# Patient Record
Sex: Male | Born: 1995 | Race: White | Hispanic: No
Health system: Southern US, Community
[De-identification: ages and names within clinical notes are randomized; demographics above are authoritative.]

---
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
## 2013-10-18 ENCOUNTER — Emergency Department (INDEPENDENT_AMBULATORY_CARE_PROVIDER_SITE_OTHER)
Admission: EM | Admit: 2013-10-18 | Discharge: 2013-10-18 | Disposition: A | Discharge status: Home / Self Care | Payer: Self-pay | Source: Home / Self Care

## 2013-10-18 ENCOUNTER — Encounter (HOSPITAL_COMMUNITY): Payer: Self-pay | Admitting: Emergency Medicine

## 2013-10-18 DIAGNOSIS — J029 Acute pharyngitis, unspecified: Secondary | ICD-10-CM

## 2013-10-18 DIAGNOSIS — J390 Retropharyngeal and parapharyngeal abscess: Secondary | ICD-10-CM

## 2013-10-18 MED ORDER — CEFTRIAXONE SODIUM 1 G IJ SOLR
INTRAMUSCULAR | Status: AC
  Filled 2013-10-18: qty 10

## 2013-10-18 MED ORDER — CLINDAMYCIN HCL 300 MG PO CAPS: 300.0000 mg | ORAL_CAPSULE | Freq: Three times a day (TID) | ORAL | Status: DC

## 2013-10-18 MED ORDER — METHYLPREDNISOLONE 4 MG PO KIT: PACK | ORAL | Status: DC

## 2013-10-18 MED ORDER — CEFTRIAXONE SODIUM 1 G IJ SOLR
1.0000 g | Freq: Once | INTRAMUSCULAR | Status: AC
  Administered 2013-10-18: 1 g via INTRAMUSCULAR

## 2013-10-18 NOTE — ED Notes (Signed)
Pt  Reports      Symptoms  Of  sorethroat      And  Pain when  She  Swallows  -   He  Reports earache as  Well       -  Pt  States  He  Took  Some  amoxcicillin he  Had  On  Hand  Last  Week        But  Still  Has  Symptoms

## 2013-10-18 NOTE — ED Provider Notes (Signed)
CSN: [id number]     Arrival date & time 10/18/13  1128 History   First MD Initiated Contact with Patient 10/18/13 1304     Chief Complaint  Patient presents with  . Sore Throat   (Consider location/radiation/quality/duration/timing/severity/associated sxs/prior Treatment) HPI Comments: 17 year old male is accompanied by his mother with a complaint of sore throat developing approximately 3 weeks ago. It is associated with earaches fever and mild PND. His mother he been given him some amoxicillin that she had left over at home. He is not improved. States that the sore throat has not improved.   History reviewed. No pertinent past medical history. History reviewed. No pertinent past surgical history. History reviewed. No pertinent family history. History  Substance Use Topics  . Smoking status: Current Every Day Smoker  . Smokeless tobacco: Not on file  . Alcohol Use: Yes    Review of Systems  Constitutional: Positive for fever, activity change and appetite change.  HENT: Positive for postnasal drip, rhinorrhea and sore throat. Negative for congestion.   Respiratory: Negative for cough, choking, shortness of breath, wheezing and stridor.   Gastrointestinal: Negative.   Skin: Negative for rash.  Neurological: Negative.     Allergies  Review of patient's allergies indicates no known allergies.  Home Medications   Current Outpatient Rx  Name  Route  Sig  Dispense  Refill  . clindamycin (CLEOCIN) 300 MG capsule   Oral   Take 1 capsule (300 mg total) by mouth 3 (three) times daily. X 10 days   30 capsule   0   . methylPREDNISolone (MEDROL DOSEPAK) 4 MG tablet      follow package directions   21 tablet   0    BP 105/76  Pulse 64  Temp(Src) 98.7 F (37.1 C) (Oral)  Resp 14  SpO2 98% Physical Exam  Nursing note and vitals reviewed. Constitutional: He is oriented to person, place, and time. He appears well-developed and well-nourished. No distress.  HENT:   Mouth/Throat: Oropharyngeal exudate present.  Bilateral TMs are translucent. Both are retracted. Oral pharynx is deeply erythematous. About time March is normal on the right however there is a small spherical area of swollen tissue that is protruding anteriorly against the left palatine arch. It is not protrude toward the midline and the uvula is unaffected. The uvula is at the midline. The airway is widely patent. There is exudate on the small area of the "abscess" in the left retropharyngeal space.  Neck: Normal range of motion. Neck supple.  Cardiovascular: Normal rate, regular rhythm and normal heart sounds.   Pulmonary/Chest: Effort normal and breath sounds normal. No respiratory distress.  Musculoskeletal: He exhibits no edema.  Lymphadenopathy:    He has cervical adenopathy.  Neurological: He is alert and oriented to person, place, and time. He exhibits normal muscle tone.  Skin: Skin is warm and dry.  Psychiatric: He has a normal mood and affect.    ED Course  Procedures (including critical care time) Labs Review Labs Reviewed  POCT RAPID STREP A ([facility] ONLY)   Imaging Review No results found. Results for orders placed during the hospital encounter of 10/18/13  POCT RAPID STREP A ([facility] ONLY)      Result Value Range   Streptococcus, Group A Screen (Direct) NEGATIVE  NEGATIVE        MDM   1. Exudative pharyngitis   2. Retropharyngeal and parapharyngeal abscess    Rocephin 1gm IM Medrol Dosepack Clindamycin 300 tid  x 10 d. For worsening go to the ED. Otherwise f/u with your ENT for which you are established, per pt request. Spoke with the on Call ENT nurse    Hayden Rasmussen, NP 10/18/13 1345

## 2013-10-21 LAB — CULTURE, GROUP A STREP

## 2013-10-21 NOTE — ED Provider Notes (Signed)
Medical screening examination/treatment/procedure(s) were performed by a resident physician or non-physician practitioner and as the supervising physician I was immediately available for consultation/collaboration.  Lillyann Ahart, MD    Brittania Sudbeck S Jame Morrell, MD 10/21/13 0900 

## 2015-04-25 ENCOUNTER — Encounter (HOSPITAL_COMMUNITY): Payer: Self-pay | Admitting: *Deleted

## 2015-04-25 ENCOUNTER — Emergency Department (INDEPENDENT_AMBULATORY_CARE_PROVIDER_SITE_OTHER)
Admission: EM | Admit: 2015-04-25 | Discharge: 2015-04-25 | Disposition: A | Discharge status: Home / Self Care | Payer: Self-pay | Source: Home / Self Care | Attending: Family Medicine | Admitting: Family Medicine

## 2015-04-25 DIAGNOSIS — L237 Allergic contact dermatitis due to plants, except food: Secondary | ICD-10-CM

## 2015-04-25 MED ORDER — TOBRAMYCIN-DEXAMETHASONE 0.3-0.1 % OP OINT: 1.0000 "application " | TOPICAL_OINTMENT | Freq: Three times a day (TID) | OPHTHALMIC | Status: DC

## 2015-04-25 NOTE — ED Notes (Addendum)
Pt  States  yest  He  Was  weedeating    Without  Glasses   And debris  Flew  Into  l  Eye   Pt  States  He  Rubbed  His  Eye        And  Now  He  Has  Some  Redness  And  puffyness    Present

## 2015-04-25 NOTE — ED Provider Notes (Addendum)
CSN: [id number]     Arrival date & time 04/25/15  1358 History   None    Chief Complaint  Patient presents with  . Eye Problem   (Consider location/radiation/quality/duration/timing/severity/associated sxs/prior Treatment) Patient is a 19 y.o. male presenting with eye problem. The history is provided by the patient.  Eye Problem Location:  L eye Quality: itching sx of lid. Onset quality:  Sudden Progression:  Unchanged Chronicity:  New Context: direct trauma   Context comment:  Weedeater use yest, without eye protection, something got on eye.-left Relieved by:  None tried Worsened by:  Nothing tried Associated symptoms: facial rash, itching and redness   Associated symptoms: no blurred vision, no decreased vision, no discharge, no double vision, no photophobia and no tearing   Associated symptoms comment:  Lower lid erythema and pruritis.   History reviewed. No pertinent past medical history. History reviewed. No pertinent past surgical history. History reviewed. No pertinent family history. History  Substance Use Topics  . Smoking status: Current Every Day Smoker  . Smokeless tobacco: Not on file  . Alcohol Use: Yes    Review of Systems  Constitutional: Negative.   HENT: Negative.   Eyes: Positive for redness and itching. Negative for blurred vision, double vision, photophobia, pain, discharge and visual disturbance.    Allergies  Review of patient's allergies indicates no known allergies.  Home Medications   Prior to Admission medications   Medication Sig Start Date End Date Taking? Authorizing Provider  clindamycin (CLEOCIN) 300 MG capsule Take 1 capsule (300 mg total) by mouth 3 (three) times daily. X 10 days 10/18/13   Hayden Rasmussenavid Mabe, NP  methylPREDNISolone (MEDROL DOSEPAK) 4 MG tablet follow package directions 10/18/13   Hayden Rasmussenavid Mabe, NP  tobramycin-dexamethasone [facility](TOBRADEX) ophthalmic ointment Place 1 application into the left eye 3 (three) times daily. 04/25/15   Linna HoffJames  D Kindl, MD   BP 110/71 mmHg  Pulse 48  Temp(Src) 98.3 F (36.8 C) (Oral)  Resp 14  SpO2 97% Physical Exam  Constitutional: He is oriented to person, place, and time. He appears well-developed and well-nourished.  Eyes: Conjunctivae and EOM are normal. Pupils are equal, round, and reactive to light. Lids are everted and swept, no foreign bodies found. Right eye exhibits no discharge. Left eye exhibits no discharge.  Slit lamp exam:      The left eye shows no corneal abrasion, no corneal ulcer and no fluorescein uptake.    Neck: Normal range of motion. Neck supple.  Lymphadenopathy:    He has no cervical adenopathy.  Neurological: He is alert and oriented to person, place, and time.  Skin: Skin is warm and dry. Rash noted. There is erythema.  Nursing note and vitals reviewed.   ED Course  Procedures (including critical care time) Labs Review Labs Reviewed - No data to display  Imaging Review No results found.   MDM   1. Contact dermatitis due to poison ivy        Linna HoffJames D Kindl, MD 04/25/15 [phone]  Linna HoffJames D Kindl, MD 04/25/15 [phone]

## 2015-04-25 NOTE — Discharge Instructions (Signed)
Use ointment as prescribed, return as needed.

## 2016-07-13 ENCOUNTER — Encounter (HOSPITAL_COMMUNITY): Payer: Self-pay | Admitting: *Deleted

## 2016-07-13 ENCOUNTER — Ambulatory Visit (HOSPITAL_COMMUNITY)
Admission: EM | Admit: 2016-07-13 | Discharge: 2016-07-13 | Disposition: A | Discharge status: Home / Self Care | Payer: Self-pay | Attending: Internal Medicine | Admitting: Internal Medicine

## 2016-07-13 DIAGNOSIS — J029 Acute pharyngitis, unspecified: Secondary | ICD-10-CM | POA: Insufficient documentation

## 2016-07-13 LAB — POCT RAPID STREP A: STREPTOCOCCUS, GROUP A SCREEN (DIRECT): NEGATIVE

## 2016-07-13 MED ORDER — ACETAMINOPHEN 325 MG PO TABS
650.0000 mg | ORAL_TABLET | Freq: Once | ORAL | Status: AC
  Administered 2016-07-13: 650 mg via ORAL

## 2016-07-13 MED ORDER — PREDNISONE 50 MG PO TABS: 50.0000 mg | ORAL_TABLET | Freq: Once | ORAL | 0 refills | Status: AC

## 2016-07-13 MED ORDER — ACETAMINOPHEN 325 MG PO TABS
ORAL_TABLET | ORAL | Status: AC
  Filled 2016-07-13: qty 2

## 2016-07-13 MED ORDER — PENICILLIN V POTASSIUM 500 MG PO TABS: 500.0000 mg | ORAL_TABLET | Freq: Two times a day (BID) | ORAL | 0 refills | Status: AC

## 2016-07-13 NOTE — ED Provider Notes (Signed)
[facility]    CSN: [id number] Arrival date & time: 07/13/16  1247  First Provider Contact:  None       History   Chief Complaint Chief Complaint  Patient presents with  . Sore Throat    Pt  has  fever  sorethroat   and    spitting up  blood      onset  of  symptoms  last  pm           HPI Darren Curtis is a 20 y.o. male. He presents with the onset of severe sore throat and fever in the last 24 hours. Throat is so sore that he is trying to avoid swallowing. Blood-tinged secretions upon arising this morning, now cleared. No runny/congested nose, not coughing. Feels hungry, just hurts to swallow. No nausea/vomiting, no diarrhea. No sick contacts.  HPI  History reviewed. No pertinent past medical history.  There are no active problems to display for this patient.   History reviewed. No pertinent surgical history.     Home Medications    Prior to Admission medications   Medication Sig Start Date End Date Taking? Authorizing Provider  penicillin v potassium (VEETID) 500 MG tablet Take 1 tablet (500 mg total) by mouth 2 (two) times daily. 07/13/16 07/23/16  Eustace Moore, MD    Family History History reviewed. No pertinent family history.  Social History Social History  Substance Use Topics  . Smoking status: Current Every Day Smoker  . Smokeless tobacco: Not on file  . Alcohol use Yes     Allergies   Review of patient's allergies indicates no known allergies.   Review of Systems Review of Systems  All other systems reviewed and are negative.    Physical Exam Triage Vital Signs ED Triage Vitals  Enc Vitals Group     BP 07/13/16 1333 132/70     Pulse Rate 07/13/16 1333 113     Resp 07/13/16 1333 20     Temp 07/13/16 1333 (!) 103.4 F (39.7 C)     Temp Source 07/13/16 1333 Oral     SpO2 07/13/16 1333 98 %     Weight 07/13/16 1333 145 lb (65.8 kg)     Height 07/13/16 1333  (1.753 m)     Pain Score 07/13/16 1353 4    Updated Vital  Signs BP 132/70 (BP Location: Right Arm)   Pulse 113   Temp (!) 103.4 F (39.7 C) (Oral) Comment: last meds taken was at 5am today, RN Alinda Money informed of Pts temp  Resp 20   Ht  (1.753 m)   Wt 145 lb (65.8 kg)   SpO2 98%   BMI 21.41 kg/m  Physical Exam  Constitutional: He appears well-developed and well-nourished.  HENT:  Head: Normocephalic and atraumatic.  Bilateral TMs are pink flushed, and translucent Minimal nasal congestion Very large tonsils, erythematous, with scant exudate  Eyes: Conjunctivae are normal.  Neck: Neck supple.  Cardiovascular: Normal rate and regular rhythm.   No murmur heard. Pulmonary/Chest: Effort normal and breath sounds normal. No respiratory distress.  Abdominal: He exhibits no distension.  Musculoskeletal: He exhibits no edema.  Neurological: He is alert.  Skin: Skin is warm and dry.  Psychiatric: He has a normal mood and affect.  Nursing note and vitals reviewed.    UC Treatments / Results  Labs (all labs ordered are listed, but only abnormal results are displayed)  Results for orders placed or performed during the  hospital encounter of 07/13/16  Culture, group A strep  Result Value Ref Range   Specimen Description THROAT    Special Requests NONE    Culture MODERATE STREPTOCOCCUS,BETA HEMOLYTIC NOT GROUP A    Report Status 07/16/2016 FINAL   POCT rapid strep A [facility])  Result Value Ref Range   Streptococcus, Group A Screen (Direct) NEGATIVE NEGATIVE        Radiology No results found.  Procedures Procedures (including critical care time)  Medications Ordered in UC Medications  acetaminophen (TYLENOL) tablet 650 mg (650 mg Oral Given 07/13/16 1400)    Final Clinical Impressions(s) / UC Diagnoses   Final diagnoses:  Pharyngitis    New Prescriptions Discharge Medication List as of 07/13/2016  2:56 PM    START taking these medications   Details  penicillin v potassium (VEETID) 500 MG tablet Take 1 tablet  (500 mg total) by mouth 2 (two) times daily., Starting Tue 07/13/2016, Until Fri 07/23/2016, Normal    predniSONE (DELTASONE) 50 MG tablet Take 1 tablet (50 mg total) by mouth once., Starting Tue 07/13/2016, Normal         Eustace Moore, MD 07/22/16 [phone]

## 2016-07-13 NOTE — Discharge Instructions (Signed)
Recheck if not starting to improve in several days, consider mono test or other causes of severe sore throat/fever.

## 2016-07-13 NOTE — ED Triage Notes (Signed)
Fever   As   Well

## 2016-07-13 NOTE — ED Notes (Signed)
Plan of  Care   Discussed   With  patient

## 2016-07-16 LAB — CULTURE, GROUP A STREP (THRC)

## 2016-12-30 DIAGNOSIS — Z0000 Encounter for general adult medical examination without abnormal findings: Secondary | ICD-10-CM | POA: Diagnosis not present

## 2016-12-30 DIAGNOSIS — Z1389 Encounter for screening for other disorder: Secondary | ICD-10-CM | POA: Diagnosis not present

## 2016-12-30 DIAGNOSIS — R8299 Other abnormal findings in urine: Secondary | ICD-10-CM | POA: Diagnosis not present

## 2016-12-30 DIAGNOSIS — E559 Vitamin D deficiency, unspecified: Secondary | ICD-10-CM | POA: Diagnosis not present

## 2016-12-30 DIAGNOSIS — Z6823 Body mass index (BMI) 23.0-23.9, adult: Secondary | ICD-10-CM | POA: Diagnosis not present

## 2017-02-10 DIAGNOSIS — R509 Fever, unspecified: Secondary | ICD-10-CM | POA: Diagnosis not present

## 2017-03-17 DIAGNOSIS — Z1389 Encounter for screening for other disorder: Secondary | ICD-10-CM | POA: Diagnosis not present

## 2017-03-17 DIAGNOSIS — R51 Headache: Secondary | ICD-10-CM | POA: Diagnosis not present

## 2017-03-17 DIAGNOSIS — E559 Vitamin D deficiency, unspecified: Secondary | ICD-10-CM | POA: Diagnosis not present

## 2017-03-17 DIAGNOSIS — Z23 Encounter for immunization: Secondary | ICD-10-CM | POA: Diagnosis not present

## 2017-04-06 DIAGNOSIS — Z6823 Body mass index (BMI) 23.0-23.9, adult: Secondary | ICD-10-CM | POA: Diagnosis not present

## 2017-04-06 DIAGNOSIS — M7090 Unspecified soft tissue disorder related to use, overuse and pressure of unspecified site: Secondary | ICD-10-CM | POA: Diagnosis not present

## 2017-07-24 ENCOUNTER — Emergency Department (HOSPITAL_COMMUNITY)
Admission: EM | Admit: 2017-07-24 | Discharge: 2017-07-25 | Disposition: A | Discharge status: Home / Self Care | Payer: BLUE CROSS/BLUE SHIELD | Attending: Emergency Medicine | Admitting: Emergency Medicine

## 2017-07-24 ENCOUNTER — Emergency Department (HOSPITAL_COMMUNITY): Payer: BLUE CROSS/BLUE SHIELD

## 2017-07-24 ENCOUNTER — Encounter (HOSPITAL_COMMUNITY): Payer: Self-pay | Admitting: Emergency Medicine

## 2017-07-24 DIAGNOSIS — X509XXA Other and unspecified overexertion or strenuous movements or postures, initial encounter: Secondary | ICD-10-CM | POA: Diagnosis not present

## 2017-07-24 DIAGNOSIS — R0781 Pleurodynia: Secondary | ICD-10-CM | POA: Diagnosis not present

## 2017-07-24 DIAGNOSIS — Y929 Unspecified place or not applicable: Secondary | ICD-10-CM | POA: Diagnosis not present

## 2017-07-24 DIAGNOSIS — Y9372 Activity, wrestling: Secondary | ICD-10-CM | POA: Insufficient documentation

## 2017-07-24 DIAGNOSIS — Y999 Unspecified external cause status: Secondary | ICD-10-CM | POA: Insufficient documentation

## 2017-07-24 DIAGNOSIS — F17210 Nicotine dependence, cigarettes, uncomplicated: Secondary | ICD-10-CM | POA: Insufficient documentation

## 2017-07-24 DIAGNOSIS — S20309A Unspecified superficial injuries of unspecified front wall of thorax, initial encounter: Secondary | ICD-10-CM | POA: Diagnosis present

## 2017-07-24 DIAGNOSIS — S20219A Contusion of unspecified front wall of thorax, initial encounter: Secondary | ICD-10-CM | POA: Diagnosis not present

## 2017-07-24 DIAGNOSIS — S20212A Contusion of left front wall of thorax, initial encounter: Secondary | ICD-10-CM

## 2017-07-24 MED ORDER — LIDOCAINE 5 % EX PTCH: 1.0000 | MEDICATED_PATCH | CUTANEOUS | 0 refills | Status: AC

## 2017-07-24 MED ORDER — LIDOCAINE 5 % EX PTCH
1.0000 | MEDICATED_PATCH | Freq: Once | CUTANEOUS | Status: DC
  Administered 2017-07-25: 1 via TRANSDERMAL
  Filled 2017-07-24: qty 1

## 2017-07-24 MED ORDER — NAPROXEN 250 MG PO TABS
500.0000 mg | ORAL_TABLET | Freq: Once | ORAL | Status: AC
  Administered 2017-07-25: 500 mg via ORAL
  Filled 2017-07-24: qty 2

## 2017-07-24 NOTE — ED Triage Notes (Signed)
Pt states he was hurt on his left rib cage 2 days ago while practicing sport and since yesterday he has increase pain and is unable to take deep breath.

## 2017-07-24 NOTE — ED Notes (Signed)
Megan from radiology called asking if xray order could be amended from plain chest xray to Left Ribs w Chest X-ray. patient's complaint included left side rib pain. New order placed.

## 2017-07-24 NOTE — Discharge Instructions (Signed)
We recommend the use of ibuprofen, 600 mg every 6 hours, for pain control. You may apply a Lidoderm patch as prescribed for additional pain control if needed. Use an incentive spirometer at least once per hour while awake to ensure that you're taking deep breaths. Also apply ice 3-4 times per day to limit swelling and pain. Use this for 15-20 minutes each time. Follow-up with a primary care doctor to ensure resolution of symptoms.

## 2017-07-25 NOTE — ED Notes (Signed)
Pt and family understood dc material. NAD noted. IS given at dc

## 2017-07-25 NOTE — ED Provider Notes (Signed)
[facility]   CSN: [id number] Arrival date & time: 07/24/17  2138     History   Chief Complaint Chief Complaint  Patient presents with  . Rib cage pain    left    HPI Darren Curtis is a 21 y.o. male.  21 year old male presents to the emergency department for left lower chest wall pain. He states that he was wrestling with his friend 2 days ago when he was "hugged really tightly". He has been having pain to his left lower chest since this time which is worse with movement and deep breathing. No medications taken prior to arrival for symptoms. Patient denies associated shortness of breath. No other injury or fall.   The history is provided by the patient. No language interpreter was used.    History reviewed. No pertinent past medical history.  There are no active problems to display for this patient.   History reviewed. No pertinent surgical history.     Home Medications    Prior to Admission medications   Medication Sig Start Date End Date Taking? Authorizing Provider  lidocaine (LIDODERM) 5 % Place 1 patch onto the skin daily. Remove & Discard patch within 12 hours or as directed by MD 07/24/17   Antony MaduraHumes, Eller Sweis, PA-C    Family History History reviewed. No pertinent family history.  Social History Social History  Substance Use Topics  . Smoking status: Current Every Day Smoker    Packs/day: 0.50    Types: Cigarettes  . Smokeless tobacco: Never Used  . Alcohol use Yes     Allergies   Patient has no known allergies.   Review of Systems Review of Systems Ten systems reviewed and are negative for acute change, except as noted in the HPI.    Physical Exam Updated Vital Signs BP (!) 114/57 (BP Location: Right Arm)   Pulse (!) 52   Temp 98 F (36.7 C) (Oral)   Resp 18   SpO2 98%   Physical Exam  Constitutional: He is oriented to person, place, and time. He appears well-developed and well-nourished. No distress.  Nontoxic and in  NAD  HENT:  Head: Normocephalic and atraumatic.  Eyes: Conjunctivae and EOM are normal. No scleral icterus.  Neck: Normal range of motion.  Cardiovascular: Normal rate, regular rhythm and intact distal pulses.   Pulmonary/Chest: Effort normal. No respiratory distress. He exhibits tenderness.  Tenderness to the left lower lateral chest wall, along the anterior axillary line. No crepitus. Chest expansion symmetric.  Musculoskeletal: Normal range of motion.  Neurological: He is alert and oriented to person, place, and time. He exhibits normal muscle tone. Coordination normal.  Skin: Skin is warm and dry. No rash noted. He is not diaphoretic. No erythema. No pallor.  No ecchymosis or contusion to chest wall  Psychiatric: He has a normal mood and affect. His behavior is normal.  Nursing note and vitals reviewed.    ED Treatments / Results  Labs (all labs ordered are listed, but only abnormal results are displayed) Labs Reviewed - No data to display  EKG  EKG Interpretation None       Radiology Dg Ribs Unilateral W/chest Left  Result Date: 07/24/2017 CLINICAL DATA:  Acute onset of left lower lateral rib pain after wrestling. Initial encounter. EXAM: LEFT RIBS AND CHEST - 3+ VIEW COMPARISON:  None. FINDINGS: No displaced rib fractures are seen. The lungs are well-aerated and clear. There is no evidence of focal opacification, pleural effusion or pneumothorax. The  cardiomediastinal silhouette is within normal limits. No acute osseous abnormalities are seen. IMPRESSION: No displaced rib fracture seen. No acute cardiopulmonary process identified. Electronically Signed   By: Roanna Raider M.D.   On: 07/24/2017 22:14    Procedures Procedures (including critical care time)  Medications Ordered in ED Medications  lidocaine (LIDODERM) 5 % 1 patch (not administered)  naproxen (NAPROSYN) tablet 500 mg (500 mg Oral Given 07/25/17 0000)     Initial Impression / Assessment and Plan / ED Course   I have reviewed the triage vital signs and the nursing notes.  Pertinent labs & imaging results that were available during my care of the patient were reviewed by me and considered in my medical decision making (see chart for details).     21 year old male presents for left lateral chest wall pain which is reproducible on palpation area x-ray negative for fracture. No PTX. Suspect chest wall contusion. Will manage supportively with NSAIDs and topical Lidoderm patch. Patient given incentive spirometer to ensure deep breathing. Return precautions discussed and provided. Patient discharged in stable condition with no unaddressed concerns.  Vitals:   07/24/17 2146 07/24/17 2341 07/24/17 2342 07/24/17 2346  BP: 124/73 118/87  (!) 114/57  Pulse: 75 (!) 57 (!) 58 (!) 52  Resp: 16   18  Temp: 98 F (36.7 C)     TempSrc: Oral     SpO2: 99% 99% 100% 98%    Final Clinical Impressions(s) / ED Diagnoses   Final diagnoses:  Chest wall contusion, left, initial encounter    New Prescriptions New Prescriptions   LIDOCAINE (LIDODERM) 5 %    Place 1 patch onto the skin daily. Remove & Discard patch within 12 hours or as directed by MD     Antony Madura, PA-C 07/25/17 0007    Lavera Guise, MD 07/27/17 [phone]

## 2017-07-26 DIAGNOSIS — R0781 Pleurodynia: Secondary | ICD-10-CM | POA: Diagnosis not present

## 2017-12-05 DIAGNOSIS — Z6824 Body mass index (BMI) 24.0-24.9, adult: Secondary | ICD-10-CM | POA: Diagnosis not present

## 2017-12-05 DIAGNOSIS — Z113 Encounter for screening for infections with a predominantly sexual mode of transmission: Secondary | ICD-10-CM | POA: Diagnosis not present

## 2017-12-05 DIAGNOSIS — J029 Acute pharyngitis, unspecified: Secondary | ICD-10-CM | POA: Diagnosis not present

## 2017-12-05 DIAGNOSIS — J020 Streptococcal pharyngitis: Secondary | ICD-10-CM | POA: Diagnosis not present

## 2018-01-16 DIAGNOSIS — H6691 Otitis media, unspecified, right ear: Secondary | ICD-10-CM | POA: Diagnosis not present

## 2018-01-16 DIAGNOSIS — Z6825 Body mass index (BMI) 25.0-25.9, adult: Secondary | ICD-10-CM | POA: Diagnosis not present

## 2018-01-16 DIAGNOSIS — J181 Lobar pneumonia, unspecified organism: Secondary | ICD-10-CM | POA: Diagnosis not present

## 2018-01-16 DIAGNOSIS — R05 Cough: Secondary | ICD-10-CM | POA: Diagnosis not present

## 2018-08-07 DIAGNOSIS — Z3169 Encounter for other general counseling and advice on procreation: Secondary | ICD-10-CM | POA: Diagnosis not present

## 2018-08-07 DIAGNOSIS — R55 Syncope and collapse: Secondary | ICD-10-CM | POA: Diagnosis not present

## 2018-08-07 DIAGNOSIS — Z6824 Body mass index (BMI) 24.0-24.9, adult: Secondary | ICD-10-CM | POA: Diagnosis not present

## 2018-09-07 DIAGNOSIS — Z3141 Encounter for fertility testing: Secondary | ICD-10-CM | POA: Diagnosis not present

## 2018-10-11 DIAGNOSIS — J069 Acute upper respiratory infection, unspecified: Secondary | ICD-10-CM | POA: Diagnosis not present

## 2018-10-11 DIAGNOSIS — R509 Fever, unspecified: Secondary | ICD-10-CM | POA: Diagnosis not present

## 2018-10-11 DIAGNOSIS — Z6823 Body mass index (BMI) 23.0-23.9, adult: Secondary | ICD-10-CM | POA: Diagnosis not present

## 2018-10-24 DIAGNOSIS — R51 Headache: Secondary | ICD-10-CM | POA: Diagnosis not present

## 2018-10-24 DIAGNOSIS — J069 Acute upper respiratory infection, unspecified: Secondary | ICD-10-CM | POA: Diagnosis not present

## 2018-10-24 DIAGNOSIS — E559 Vitamin D deficiency, unspecified: Secondary | ICD-10-CM | POA: Diagnosis not present

## 2018-10-24 DIAGNOSIS — Z23 Encounter for immunization: Secondary | ICD-10-CM | POA: Diagnosis not present

## 2019-02-15 DIAGNOSIS — S62336A Displaced fracture of neck of fifth metacarpal bone, right hand, initial encounter for closed fracture: Secondary | ICD-10-CM | POA: Diagnosis not present

## 2019-02-16 DIAGNOSIS — X58XXXA Exposure to other specified factors, initial encounter: Secondary | ICD-10-CM | POA: Diagnosis not present

## 2019-02-16 DIAGNOSIS — S62326A Displaced fracture of shaft of fifth metacarpal bone, right hand, initial encounter for closed fracture: Secondary | ICD-10-CM | POA: Diagnosis not present

## 2019-02-16 DIAGNOSIS — Y999 Unspecified external cause status: Secondary | ICD-10-CM | POA: Diagnosis not present

## 2019-02-20 DIAGNOSIS — M25641 Stiffness of right hand, not elsewhere classified: Secondary | ICD-10-CM | POA: Diagnosis not present

## 2019-02-20 DIAGNOSIS — M79644 Pain in right finger(s): Secondary | ICD-10-CM | POA: Diagnosis not present

## 2019-02-20 DIAGNOSIS — S62336A Displaced fracture of neck of fifth metacarpal bone, right hand, initial encounter for closed fracture: Secondary | ICD-10-CM | POA: Diagnosis not present

## 2019-02-27 DIAGNOSIS — S62336A Displaced fracture of neck of fifth metacarpal bone, right hand, initial encounter for closed fracture: Secondary | ICD-10-CM | POA: Diagnosis not present

## 2019-03-22 DIAGNOSIS — S62336A Displaced fracture of neck of fifth metacarpal bone, right hand, initial encounter for closed fracture: Secondary | ICD-10-CM | POA: Diagnosis not present

## 2019-08-23 DIAGNOSIS — M79672 Pain in left foot: Secondary | ICD-10-CM | POA: Diagnosis not present

## 2019-08-23 DIAGNOSIS — M12272 Villonodular synovitis (pigmented), left ankle and foot: Secondary | ICD-10-CM | POA: Diagnosis not present

## 2020-01-30 ENCOUNTER — Ambulatory Visit: Payer: BC Managed Care – PPO | Attending: Internal Medicine

## 2020-01-30 DIAGNOSIS — Z20822 Contact with and (suspected) exposure to covid-19: Secondary | ICD-10-CM | POA: Diagnosis not present

## 2020-01-31 LAB — NOVEL CORONAVIRUS, NAA: SARS-CoV-2, NAA: NOT DETECTED

## 2020-02-11 DIAGNOSIS — L03114 Cellulitis of left upper limb: Secondary | ICD-10-CM | POA: Diagnosis not present

## 2020-02-11 DIAGNOSIS — S21132A Puncture wound without foreign body of left front wall of thorax without penetration into thoracic cavity, initial encounter: Secondary | ICD-10-CM | POA: Diagnosis not present

## 2020-02-11 DIAGNOSIS — S41132A Puncture wound without foreign body of left upper arm, initial encounter: Secondary | ICD-10-CM | POA: Diagnosis not present

## 2020-02-25 DIAGNOSIS — Z1331 Encounter for screening for depression: Secondary | ICD-10-CM | POA: Diagnosis not present

## 2020-02-25 DIAGNOSIS — E559 Vitamin D deficiency, unspecified: Secondary | ICD-10-CM | POA: Diagnosis not present

## 2020-02-25 DIAGNOSIS — F39 Unspecified mood [affective] disorder: Secondary | ICD-10-CM | POA: Diagnosis not present

## 2020-07-05 ENCOUNTER — Encounter (HOSPITAL_COMMUNITY): Payer: Self-pay

## 2020-07-05 ENCOUNTER — Emergency Department (HOSPITAL_COMMUNITY)
Admission: EM | Admit: 2020-07-05 | Discharge: 2020-07-05 | Disposition: A | Discharge status: Home / Self Care | Payer: BC Managed Care – PPO | Attending: Emergency Medicine | Admitting: Emergency Medicine

## 2020-07-05 ENCOUNTER — Emergency Department (HOSPITAL_COMMUNITY): Payer: BC Managed Care – PPO

## 2020-07-05 ENCOUNTER — Other Ambulatory Visit: Payer: Self-pay

## 2020-07-05 DIAGNOSIS — Z20822 Contact with and (suspected) exposure to covid-19: Secondary | ICD-10-CM | POA: Insufficient documentation

## 2020-07-05 DIAGNOSIS — E876 Hypokalemia: Secondary | ICD-10-CM

## 2020-07-05 DIAGNOSIS — R197 Diarrhea, unspecified: Secondary | ICD-10-CM | POA: Insufficient documentation

## 2020-07-05 DIAGNOSIS — R6883 Chills (without fever): Secondary | ICD-10-CM | POA: Insufficient documentation

## 2020-07-05 DIAGNOSIS — E860 Dehydration: Secondary | ICD-10-CM | POA: Diagnosis not present

## 2020-07-05 DIAGNOSIS — Z79899 Other long term (current) drug therapy: Secondary | ICD-10-CM | POA: Insufficient documentation

## 2020-07-05 DIAGNOSIS — F17210 Nicotine dependence, cigarettes, uncomplicated: Secondary | ICD-10-CM | POA: Diagnosis not present

## 2020-07-05 DIAGNOSIS — R109 Unspecified abdominal pain: Secondary | ICD-10-CM | POA: Diagnosis not present

## 2020-07-05 DIAGNOSIS — K6389 Other specified diseases of intestine: Secondary | ICD-10-CM | POA: Diagnosis not present

## 2020-07-05 DIAGNOSIS — R112 Nausea with vomiting, unspecified: Secondary | ICD-10-CM | POA: Diagnosis not present

## 2020-07-05 DIAGNOSIS — K3189 Other diseases of stomach and duodenum: Secondary | ICD-10-CM | POA: Diagnosis not present

## 2020-07-05 DIAGNOSIS — R1084 Generalized abdominal pain: Secondary | ICD-10-CM

## 2020-07-05 LAB — CBC
HCT: 47.2 % (ref 39.0–52.0)
Hemoglobin: 16.5 g/dL (ref 13.0–17.0)
MCH: 31.7 pg (ref 26.0–34.0)
MCHC: 35 g/dL (ref 30.0–36.0)
MCV: 90.8 fL (ref 80.0–100.0)
Platelets: 285 10*3/uL (ref 150–400)
RBC: 5.2 MIL/uL (ref 4.22–5.81)
RDW: 12 % (ref 11.5–15.5)
WBC: 19.8 10*3/uL — ABNORMAL HIGH (ref 4.0–10.5)
nRBC: 0 % (ref 0.0–0.2)

## 2020-07-05 LAB — URINALYSIS, ROUTINE W REFLEX MICROSCOPIC
Bacteria, UA: NONE SEEN
Bilirubin Urine: NEGATIVE
Glucose, UA: 50 mg/dL — AB
Hgb urine dipstick: NEGATIVE
Ketones, ur: 80 mg/dL — AB
Leukocytes,Ua: NEGATIVE
Nitrite: NEGATIVE
Protein, ur: 30 mg/dL — AB
Specific Gravity, Urine: 1.032 — ABNORMAL HIGH (ref 1.005–1.030)
pH: 5 (ref 5.0–8.0)

## 2020-07-05 LAB — COMPREHENSIVE METABOLIC PANEL
ALT: 30 U/L (ref 0–44)
AST: 24 U/L (ref 15–41)
Albumin: 5.3 g/dL — ABNORMAL HIGH (ref 3.5–5.0)
Alkaline Phosphatase: 53 U/L (ref 38–126)
Anion gap: 18 — ABNORMAL HIGH (ref 5–15)
BUN: 18 mg/dL (ref 6–20)
CO2: 24 mmol/L (ref 22–32)
Calcium: 10 mg/dL (ref 8.9–10.3)
Chloride: 100 mmol/L (ref 98–111)
Creatinine, Ser: 0.94 mg/dL (ref 0.61–1.24)
GFR calc Af Amer: >60 mL/min (ref >60)
GFR calc non Af Amer: >60 mL/min (ref >60)
Glucose, Bld: 197 mg/dL — ABNORMAL HIGH (ref 70–99)
Potassium: 3.4 mmol/L — ABNORMAL LOW (ref 3.5–5.1)
Sodium: 142 mmol/L (ref 135–145)
Total Bilirubin: 1.2 mg/dL (ref 0.3–1.2)
Total Protein: 8.4 g/dL — ABNORMAL HIGH (ref 6.5–8.1)

## 2020-07-05 LAB — BASIC METABOLIC PANEL
Anion gap: 12 (ref 5–15)
BUN: 14 mg/dL (ref 6–20)
CO2: 21 mmol/L — ABNORMAL LOW (ref 22–32)
Calcium: 7.1 mg/dL — ABNORMAL LOW (ref 8.9–10.3)
Chloride: 112 mmol/L — ABNORMAL HIGH (ref 98–111)
Creatinine, Ser: 0.59 mg/dL — ABNORMAL LOW (ref 0.61–1.24)
GFR calc Af Amer: >60 mL/min (ref >60)
GFR calc non Af Amer: >60 mL/min (ref >60)
Glucose, Bld: 97 mg/dL (ref 70–99)
Potassium: 2.6 mmol/L — CL (ref 3.5–5.1)
Sodium: 145 mmol/L (ref 135–145)

## 2020-07-05 LAB — CBC WITH DIFFERENTIAL/PLATELET
Abs Immature Granulocytes: 0.07 10*3/uL (ref 0.00–0.07)
Basophils Absolute: 0 10*3/uL (ref 0.0–0.1)
Basophils Relative: 0 %
Eosinophils Absolute: 0 10*3/uL (ref 0.0–0.5)
Eosinophils Relative: 0 %
HCT: 42.3 % (ref 39.0–52.0)
Hemoglobin: 14.8 g/dL (ref 13.0–17.0)
Immature Granulocytes: 0 %
Lymphocytes Relative: 3 %
Lymphs Abs: 0.5 10*3/uL — ABNORMAL LOW (ref 0.7–4.0)
MCH: 31.2 pg (ref 26.0–34.0)
MCHC: 35 g/dL (ref 30.0–36.0)
MCV: 89.1 fL (ref 80.0–100.0)
Monocytes Absolute: 0.9 10*3/uL (ref 0.1–1.0)
Monocytes Relative: 5 %
Neutro Abs: 15.1 10*3/uL — ABNORMAL HIGH (ref 1.7–7.7)
Neutrophils Relative %: 92 %
Platelets: 236 10*3/uL (ref 150–400)
RBC: 4.75 MIL/uL (ref 4.22–5.81)
RDW: 11.9 % (ref 11.5–15.5)
WBC: 16.6 10*3/uL — ABNORMAL HIGH (ref 4.0–10.5)
nRBC: 0 % (ref 0.0–0.2)

## 2020-07-05 LAB — SARS CORONAVIRUS 2 BY RT PCR (HOSPITAL ORDER, PERFORMED IN ~~LOC~~ HOSPITAL LAB): SARS Coronavirus 2: NEGATIVE

## 2020-07-05 LAB — LIPASE, BLOOD: Lipase: 22 U/L (ref 11–51)

## 2020-07-05 MED ORDER — SODIUM CHLORIDE (PF) 0.9 % IJ SOLN
INTRAMUSCULAR | Status: AC
  Filled 2020-07-05: qty 50

## 2020-07-05 MED ORDER — ONDANSETRON 4 MG PO TBDP
4.0000 mg | ORAL_TABLET | Freq: Three times a day (TID) | ORAL | 0 refills | Status: DC | PRN
Start: 2020-07-05 — End: 2022-01-21

## 2020-07-05 MED ORDER — SODIUM CHLORIDE 0.9 % IV BOLUS: 1000.0000 mL | Freq: Once | INTRAVENOUS | Status: DC

## 2020-07-05 MED ORDER — SODIUM CHLORIDE 0.9 % IV SOLN
8.0000 mg | Freq: Once | INTRAVENOUS | Status: AC
  Administered 2020-07-05: 8 mg via INTRAVENOUS
  Filled 2020-07-05: qty 4

## 2020-07-05 MED ORDER — ONDANSETRON 4 MG PO TBDP
4.0000 mg | ORAL_TABLET | Freq: Once | ORAL | Status: AC
  Administered 2020-07-05: 4 mg via ORAL
  Filled 2020-07-05: qty 1

## 2020-07-05 MED ORDER — POTASSIUM CHLORIDE CRYS ER 20 MEQ PO TBCR
20.0000 meq | EXTENDED_RELEASE_TABLET | Freq: Every day | ORAL | 0 refills | Status: AC
Start: 2020-07-05 — End: 2020-07-10

## 2020-07-05 MED ORDER — IOHEXOL 300 MG/ML  SOLN
100.0000 mL | Freq: Once | INTRAMUSCULAR | Status: AC | PRN
  Administered 2020-07-05: 100 mL via INTRAVENOUS

## 2020-07-05 MED ORDER — LACTATED RINGERS IV BOLUS
2000.0000 mL | Freq: Once | INTRAVENOUS | Status: AC
  Administered 2020-07-05: 2000 mL via INTRAVENOUS

## 2020-07-05 MED ORDER — POTASSIUM CHLORIDE CRYS ER 20 MEQ PO TBCR
40.0000 meq | EXTENDED_RELEASE_TABLET | Freq: Once | ORAL | Status: AC
  Administered 2020-07-05: 40 meq via ORAL
  Filled 2020-07-05: qty 2

## 2020-07-05 NOTE — ED Provider Notes (Signed)
[facility]-EMERGENCY DEPT Provider Note   CSN: [id number] Arrival date & time: 07/05/20  0006     History Chief Complaint  Patient presents with  . Emesis    Darren Curtis is a 24 y.o. male.  24 year old male presents with complaint of periumbilical abdominal pain with nausea, vomiting, diarrhea.  Patient states that he developed abdominal pain yesterday afternoon described as cramping and sharp, intermittent, worse with walking, improves with rest, radiates to lower abdomen.  Patient reports numerous loose stools yesterday while at work, states that 11 PM last night began vomiting, unable to keep anything down since.  Patient was given Zofran on arrival in the ER, states he is continued to dry heave, has had an extensive wait in the lobby and has tried sipping small sips of water but continues to vomit.  Reports chills, no known fevers.  Denies chest antibiotics, recent travel, known sick contacts.  No prior abdominal surgeries, no significant past medical history, is a daily smoker.  No other complaints or concerns.        History reviewed. No pertinent past medical history.  There are no problems to display for this patient.   History reviewed. No pertinent surgical history.     No family history on file.  Social History   Tobacco Use  . Smoking status: Current Every Day Smoker    Packs/day: 0.50    Types: Cigarettes  . Smokeless tobacco: Never Used  Substance Use Topics  . Alcohol use: Yes  . Drug use: No    Home Medications Prior to Admission medications   Medication Sig Start Date End Date Taking? Authorizing Provider  Ascorbic Acid (VITAMIN C PO) Take 1 tablet by mouth daily.   Yes [provider]  lidocaine (LIDODERM) 5 % Place 1 patch onto the skin daily. Remove & Discard patch within 12 hours or as directed by MD Patient not taking: Reported on 07/05/2020 07/24/17   Antony Madura, PA-C  ondansetron (ZOFRAN ODT) 4 MG  disintegrating tablet Take 1 tablet (4 mg total) by mouth every 8 (eight) hours as needed for nausea or vomiting. 07/05/20   Army Melia A, PA-C  potassium chloride SA (KLOR-CON) 20 MEQ tablet Take 1 tablet (20 mEq total) by mouth daily for 5 days. 07/05/20 07/10/20  Jeannie Fend, PA-C    Allergies    Patient has no known allergies.  Review of Systems   Review of Systems  Constitutional: Positive for chills. Negative for fever.  Respiratory: Negative for shortness of breath.   Cardiovascular: Negative for chest pain.  Gastrointestinal: Positive for abdominal pain, diarrhea, nausea and vomiting. Negative for constipation.  Genitourinary: Negative for difficulty urinating and dysuria.  Musculoskeletal: Negative for arthralgias and myalgias.  Skin: Negative for rash and wound.  Allergic/Immunologic: Negative for immunocompromised state.  Neurological: Negative for weakness.  Psychiatric/Behavioral: Negative for confusion.  All other systems reviewed and are negative.   Physical Exam Updated Vital Signs BP (!) 143/73   Pulse 62   Temp 98.2 F (36.8 C) (Oral)   Resp 15   Ht 5\' 10"  (1.778 m)   Wt 61.2 kg   SpO2 96%   BMI 19.37 kg/m   Physical Exam Vitals and nursing note reviewed.  Constitutional:      General: He is not in acute distress.    Appearance: He is well-developed. He is not diaphoretic.  HENT:     Head: Normocephalic and atraumatic.  Cardiovascular:     Rate  and Rhythm: Normal rate and regular rhythm.     Pulses: Normal pulses.     Heart sounds: Normal heart sounds.  Pulmonary:     Effort: Pulmonary effort is normal.  Abdominal:     Palpations: Abdomen is soft.     Tenderness: There is abdominal tenderness in the right lower quadrant and left lower quadrant. There is no guarding or rebound. Negative signs include Lesean Woolverton's sign.  Skin:    General: Skin is warm and dry.     Findings: No erythema or rash.  Neurological:     Mental Status: He is alert and  oriented to person, place, and time.  Psychiatric:        Behavior: Behavior normal.     ED Results / Procedures / Treatments   Labs (all labs ordered are listed, but only abnormal results are displayed) Labs Reviewed  COMPREHENSIVE METABOLIC PANEL - Abnormal; Notable for the following components:      Result Value   Potassium 3.4 (*)    Glucose, Bld 197 (*)    Total Protein 8.4 (*)    Albumin 5.3 (*)    Anion gap 18 (*)    All other components within normal limits  CBC - Abnormal; Notable for the following components:   WBC 19.8 (*)    All other components within normal limits  URINALYSIS, ROUTINE W REFLEX MICROSCOPIC - Abnormal; Notable for the following components:   Color, Urine AMBER (*)    Specific Gravity, Urine 1.032 (*)    Glucose, UA 50 (*)    Ketones, ur 80 (*)    Protein, ur 30 (*)    All other components within normal limits  BASIC METABOLIC PANEL - Abnormal; Notable for the following components:   Potassium 2.6 (*)    Chloride 112 (*)    CO2 21 (*)    Creatinine, Ser 0.59 (*)    Calcium 7.1 (*)    All other components within normal limits  CBC WITH DIFFERENTIAL/PLATELET - Abnormal; Notable for the following components:   WBC 16.6 (*)    Neutro Abs 15.1 (*)    Lymphs Abs 0.5 (*)    All other components within normal limits  SARS CORONAVIRUS 2 BY RT PCR (HOSPITAL ORDER, PERFORMED IN [facility])  LIPASE, BLOOD    EKG None  Radiology CT Abdomen Pelvis W Contrast  Result Date: 07/05/2020 CLINICAL DATA:  Right lower quadrant abdominal pain, nausea and vomiting. EXAM: CT ABDOMEN AND PELVIS WITH CONTRAST TECHNIQUE: Multidetector CT imaging of the abdomen and pelvis was performed using the standard protocol following bolus administration of intravenous contrast. CONTRAST:  OMNIPAQUE IOHEXOL 300 MG/ML  SOLN COMPARISON:  None. FINDINGS: Lower chest: Unremarkable. Hepatobiliary: No focal liver abnormality is seen. No gallstones, gallbladder wall  thickening, or biliary dilatation. Pancreas: Unremarkable. No pancreatic ductal dilatation or surrounding inflammatory changes. Spleen: Normal in size without focal abnormality. Adrenals/Urinary Tract: Adrenal glands are unremarkable. Kidneys are normal, without renal calculi, focal lesion, or hydronephrosis. Bladder is unremarkable. Stomach/Bowel: Mild gastric and jejunal mucosal thickening and enhancement. The jejunal loops are borderline dilated and partially filled with fluid. Unremarkable colon. No evidence of appendicitis. Vascular/Lymphatic: No significant vascular findings are present. No enlarged abdominal or pelvic lymph nodes. Reproductive: Prostate is unremarkable. Other: No abdominal wall hernia or abnormality. No abdominopelvic ascites. Musculoskeletal: Bilateral L5 pars interarticularis defects with minimal anterolisthesis at the L5-S1 level. IMPRESSION: 1. Mild gastric and jejunal mucosal thickening and enhancement, compatible with gastroenteritis. 2.  Bilateral L5 spondylolysis with minimal spondylolisthesis at the L5-S1 level. Electronically Signed   By: Beckie Salts M.D.   On: 07/05/2020 11:03    Procedures Procedures (including critical care time)  Medications Ordered in ED Medications  sodium chloride (PF) 0.9 % injection (has no administration in time range)  potassium chloride SA (KLOR-CON) CR tablet 40 mEq (has no administration in time range)  ondansetron (ZOFRAN-ODT) disintegrating tablet 4 mg (4 mg Oral Given 07/05/20 0159)  lactated ringers bolus 2,000 mL (2,000 mLs Intravenous New Bag/Given 07/05/20 1043)  ondansetron (ZOFRAN) 8 mg in sodium chloride 0.9 % 50 mL IVPB (0 mg Intravenous Stopped 07/05/20 1100)  iohexol (OMNIPAQUE) 300 MG/ML solution 100 mL (100 mLs Intravenous Contrast Given 07/05/20 1029)    ED Course  I have reviewed the triage vital signs and the nursing notes.  Pertinent labs & imaging results that were available during my care of the patient were reviewed  by me and considered in my medical decision making (see chart for details).  Clinical Course as of Jul 05 1337  Sat Jul 05, 2020  [id number] 24 year old male with complaint of nausea, vomiting, diarrhea and abdominal pain onset yesterday.  Patient was given Zofran in the lobby without improvement in his vomiting, had approximately 10-hour wait in the lobby with persistent vomiting and diarrhea.  At time evaluation, patient is well-appearing, has lower abdominal tenderness.  Labs resulted showing white count of 19.8, possibly due to his vomiting however with report of periumbilical no lower abdominal pain concern for acute appendicitis.  CMP with potassium of 3.4, anion gap of 18.  Urinalysis with ketones and protein as well as glucose. CT abdomen pelvis with findings consistent with acute gastroenteritis, normal appendix visualized. Labs repeated with improvement in WBC now 16.6 however potassium now 2.6. On recheck, patient is feeling much better, no no longer nauseous or vomiting after Zofran and 1 L LR.  Plan is to give oral potassium, second liter of LR and discharge home with Rx for potassium if tolerating p.o.'s.  Discussed with patient, has PCP and will contact them on Monday for follow-up and repeat labs. Recommend clear liquids, advance to bland foods slowly as tolerated.    [LM]    Clinical Course User Index [LM] Alden Hipp   MDM Rules/Calculators/A&P                          Final Clinical Impression(s) / ED Diagnoses Final diagnoses:  Generalized abdominal pain  Nausea vomiting and diarrhea  Dehydration  Hypokalemia    Rx / DC Orders ED Discharge Orders         Ordered    potassium chloride SA (KLOR-CON) 20 MEQ tablet  Daily     Discontinue  Reprint     07/05/20 1337    ondansetron (ZOFRAN ODT) 4 MG disintegrating tablet  Every 8 hours PRN     Discontinue  Reprint     07/05/20 1337           Jeannie Fend, PA-C 07/05/20 1338    Cur[place], Adam, DO 07/05/20  1431

## 2020-07-05 NOTE — ED Triage Notes (Signed)
Pt sts vomiting and dry heaving all night.

## 2020-07-05 NOTE — Discharge Instructions (Signed)
Take Zofran as needed as prescribed for nausea and vomiting. Take potassium daily for the next 5 days. Recheck with your doctor next week. Return to ER for new or worsening symptoms.

## 2020-07-07 DIAGNOSIS — Z1331 Encounter for screening for depression: Secondary | ICD-10-CM | POA: Diagnosis not present

## 2020-07-07 DIAGNOSIS — Z0000 Encounter for general adult medical examination without abnormal findings: Secondary | ICD-10-CM | POA: Diagnosis not present

## 2020-07-07 DIAGNOSIS — Z13220 Encounter for screening for lipoid disorders: Secondary | ICD-10-CM | POA: Diagnosis not present

## 2021-01-12 DIAGNOSIS — Z20822 Contact with and (suspected) exposure to covid-19: Secondary | ICD-10-CM | POA: Diagnosis not present

## 2021-01-12 DIAGNOSIS — R6889 Other general symptoms and signs: Secondary | ICD-10-CM | POA: Diagnosis not present

## 2021-01-12 DIAGNOSIS — J029 Acute pharyngitis, unspecified: Secondary | ICD-10-CM | POA: Diagnosis not present

## 2021-01-12 IMAGING — CT CT ABD-PELV W/ CM
2 of 4 series · 16 of 46 positions shown, 18 images · IV contrast (omnipaque)
Comparison: None.

CLINICAL DATA: Right lower quadrant abdominal pain, nausea and
vomiting.

EXAM:
CT ABDOMEN AND PELVIS WITH CONTRAST
TECHNIQUE: Multidetector CT imaging of the abdomen and pelvis was performed
using the standard protocol following bolus administration of
intravenous contrast.
CONTRAST:  100mL OMNIPAQUE IOHEXOL 300 MG/ML  SOLN

[Series 2: axial st · axial · 0.68mm/px · z∈[-515,-95]mm · 13 of 96 slices shown, 15 images]
[im 6/96  soft-tissue]
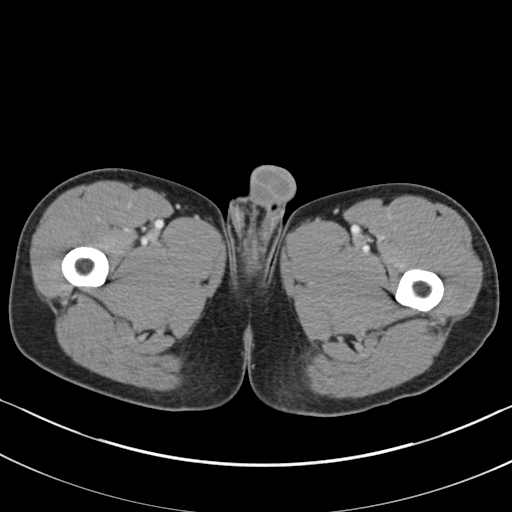
[im 6/96  bone]
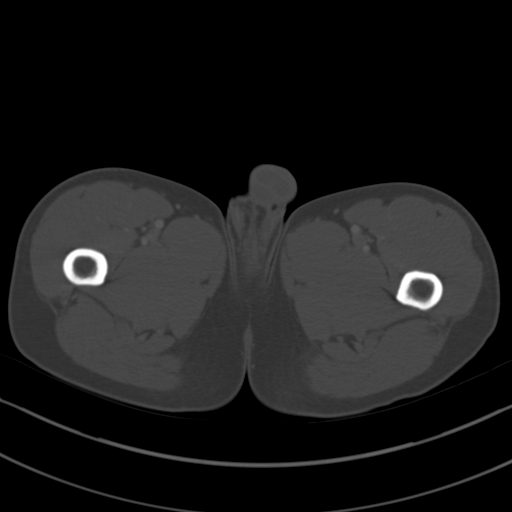
[im 11/96  soft-tissue]
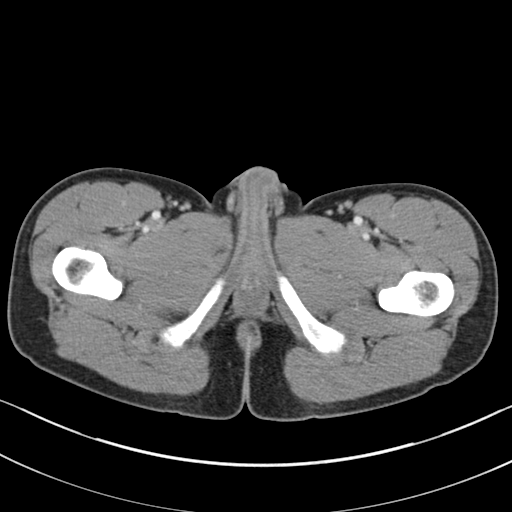
[im 22/96  soft-tissue]
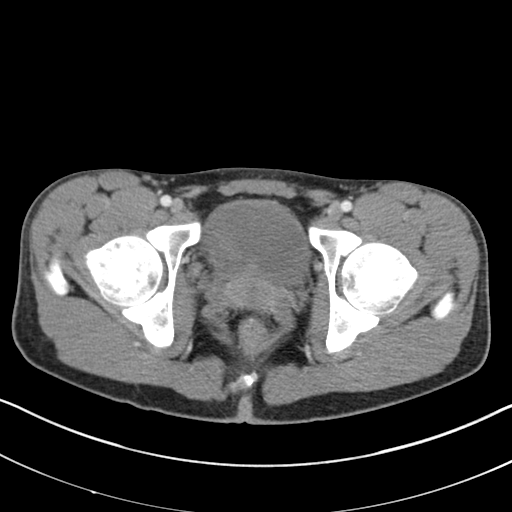
[im 27/96  soft-tissue]
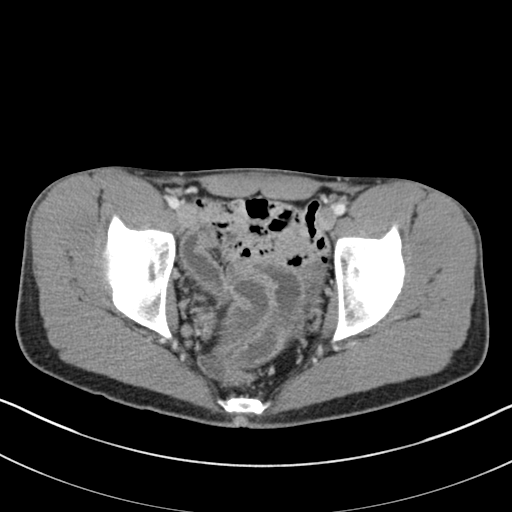
[im 32/96  soft-tissue]
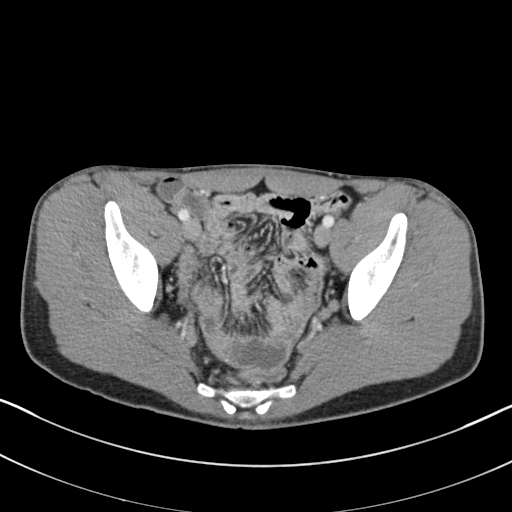
[im 43/96  soft-tissue]
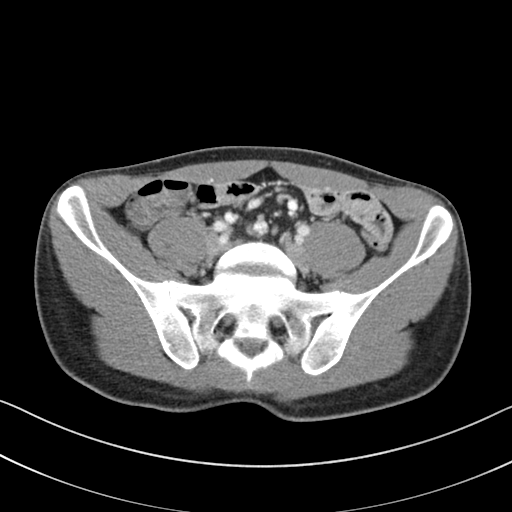
[im 48/96  soft-tissue]
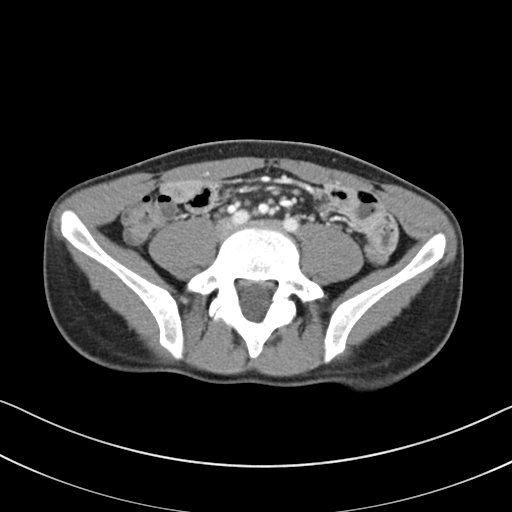
[im 53/96  soft-tissue]
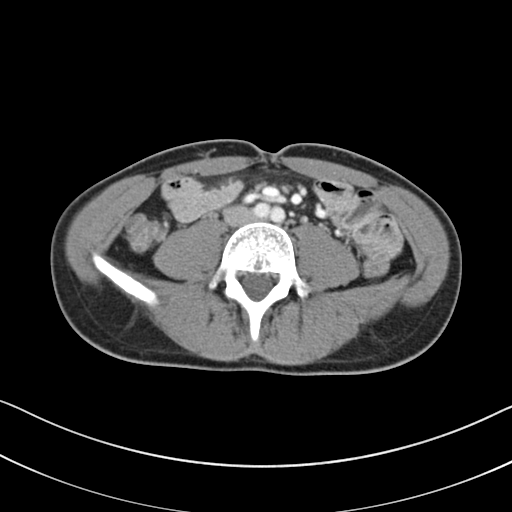
[im 64/96  soft-tissue]
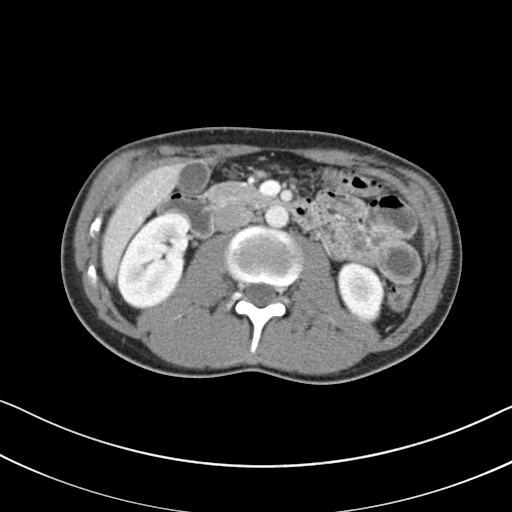
[im 64/96  bone]
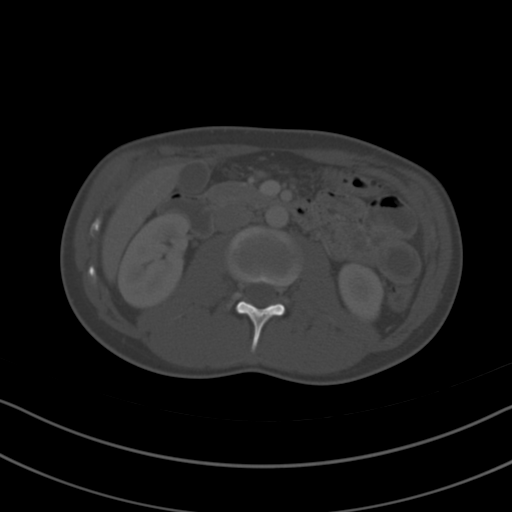
[im 69/96  soft-tissue]
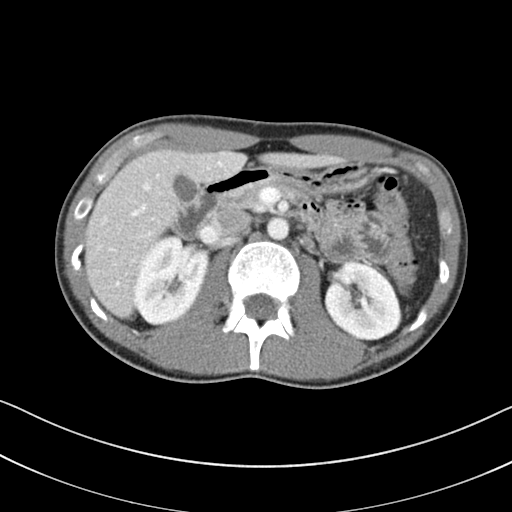
[im 74/96  soft-tissue]
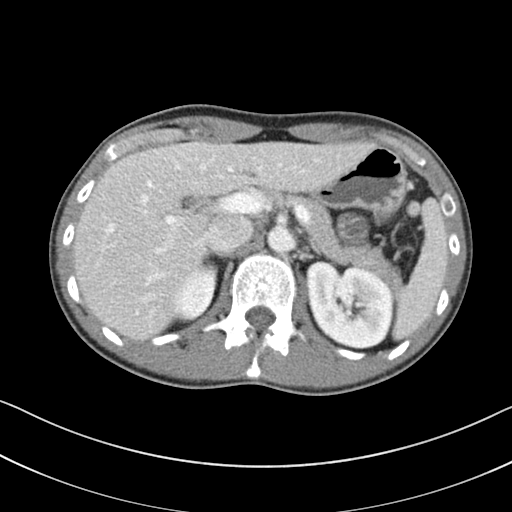
[im 85/96  soft-tissue]
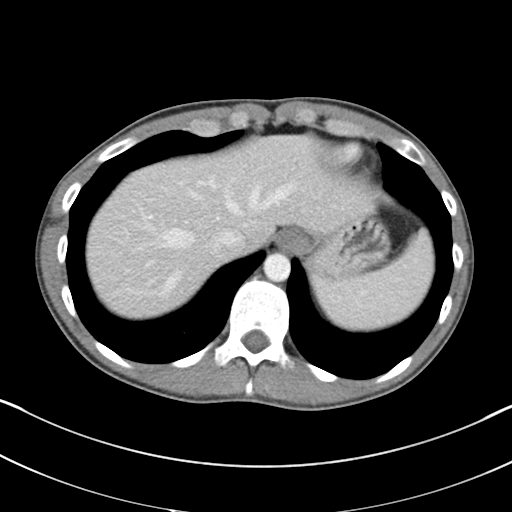
[im 90/96  soft-tissue]
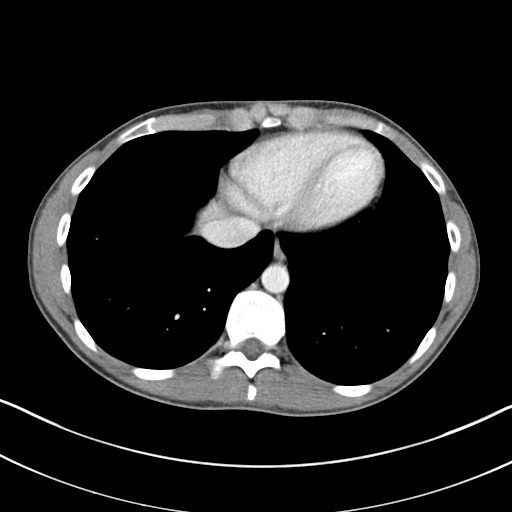

[Series 4: coronal st · coronal · 0.77mm/px · 3 of 114 slices shown]
[im 38/114  soft-tissue]
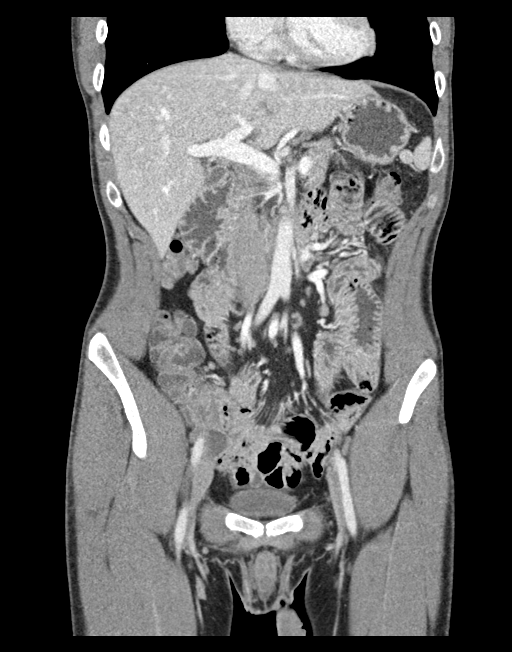
[im 51/114  soft-tissue]
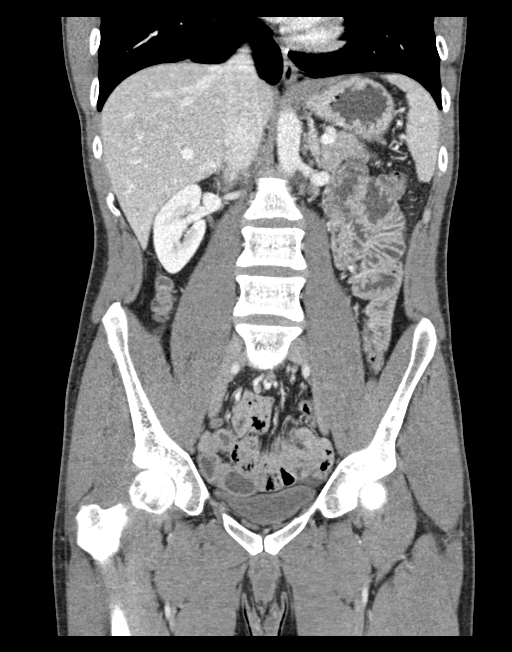
[im 63/114  soft-tissue]
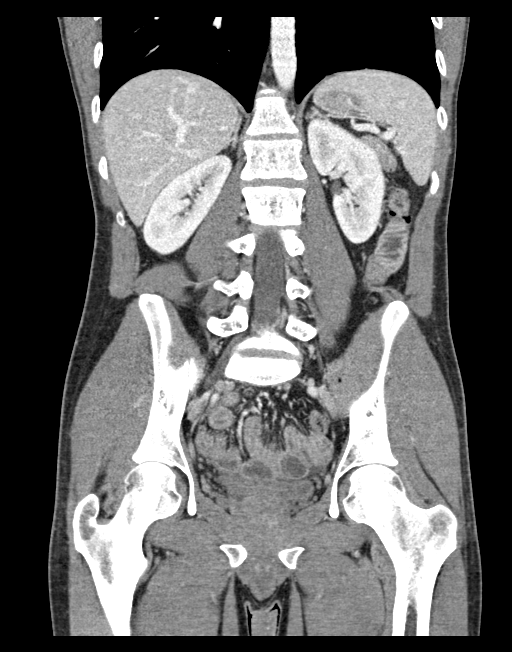

[16 of 46 positions shown; findings below may reference images not displayed]

FINDINGS: Lower chest: Unremarkable.

Hepatobiliary: No focal liver abnormality is seen. No gallstones,
gallbladder wall thickening, or biliary dilatation.

Pancreas: Unremarkable. No pancreatic ductal dilatation or
surrounding inflammatory changes.

Spleen: Normal in size without focal abnormality.

Adrenals/Urinary Tract: Adrenal glands are unremarkable. Kidneys are
normal, without renal calculi, focal lesion, or hydronephrosis.
Bladder is unremarkable.

Stomach/Bowel: Mild gastric and jejunal mucosal thickening and
enhancement. The jejunal loops are borderline dilated and partially
filled with fluid. Unremarkable colon. No evidence of appendicitis.

Vascular/Lymphatic: No significant vascular findings are present. No
enlarged abdominal or pelvic lymph nodes.

Reproductive: Prostate is unremarkable.

Other: No abdominal wall hernia or abnormality. No abdominopelvic
ascites.

Musculoskeletal: Bilateral L5 pars interarticularis defects with
minimal anterolisthesis at the L5-S1 level.
IMPRESSION: 1. Mild gastric and jejunal mucosal thickening and enhancement,
compatible with gastroenteritis.
2. Bilateral L5 spondylolysis with minimal spondylolisthesis at the
L5-S1 level.

## 2021-02-14 DIAGNOSIS — Z13228 Encounter for screening for other metabolic disorders: Secondary | ICD-10-CM | POA: Diagnosis not present

## 2021-03-14 DIAGNOSIS — Z23 Encounter for immunization: Secondary | ICD-10-CM | POA: Diagnosis not present

## 2021-03-14 DIAGNOSIS — Z0000 Encounter for general adult medical examination without abnormal findings: Secondary | ICD-10-CM | POA: Diagnosis not present

## 2021-03-14 DIAGNOSIS — F17200 Nicotine dependence, unspecified, uncomplicated: Secondary | ICD-10-CM | POA: Diagnosis not present

## 2021-03-14 DIAGNOSIS — F909 Attention-deficit hyperactivity disorder, unspecified type: Secondary | ICD-10-CM | POA: Diagnosis not present

## 2022-01-21 ENCOUNTER — Emergency Department (HOSPITAL_COMMUNITY)
Admission: EM | Admit: 2022-01-21 | Discharge: 2022-01-21 | Disposition: A | Discharge status: Home / Self Care | Payer: No Typology Code available for payment source | Attending: Emergency Medicine | Admitting: Emergency Medicine

## 2022-01-21 ENCOUNTER — Other Ambulatory Visit: Payer: Self-pay

## 2022-01-21 DIAGNOSIS — F12188 Cannabis abuse with other cannabis-induced disorder: Secondary | ICD-10-CM | POA: Diagnosis not present

## 2022-01-21 DIAGNOSIS — R112 Nausea with vomiting, unspecified: Secondary | ICD-10-CM | POA: Diagnosis present

## 2022-01-21 DIAGNOSIS — Z79899 Other long term (current) drug therapy: Secondary | ICD-10-CM | POA: Insufficient documentation

## 2022-01-21 DIAGNOSIS — Z20822 Contact with and (suspected) exposure to covid-19: Secondary | ICD-10-CM | POA: Insufficient documentation

## 2022-01-21 LAB — COMPREHENSIVE METABOLIC PANEL
ALT: 32 U/L (ref 0–44)
AST: 27 U/L (ref 15–41)
Albumin: 5.2 g/dL — ABNORMAL HIGH (ref 3.5–5.0)
Alkaline Phosphatase: 42 U/L (ref 38–126)
Anion gap: 14 (ref 5–15)
BUN: 9 mg/dL (ref 6–20)
CO2: 26 mmol/L (ref 22–32)
Calcium: 10.2 mg/dL (ref 8.9–10.3)
Chloride: 99 mmol/L (ref 98–111)
Creatinine, Ser: 0.95 mg/dL (ref 0.61–1.24)
GFR, Estimated: >60 mL/min (ref >60)
Glucose, Bld: 154 mg/dL — ABNORMAL HIGH (ref 70–99)
Potassium: 3.6 mmol/L (ref 3.5–5.1)
Sodium: 139 mmol/L (ref 135–145)
Total Bilirubin: 0.8 mg/dL (ref 0.3–1.2)
Total Protein: 7.9 g/dL (ref 6.5–8.1)

## 2022-01-21 LAB — RESP PANEL BY RT-PCR (FLU A&B, COVID) ARPGX2
Influenza A by PCR: NEGATIVE
Influenza B by PCR: NEGATIVE
SARS Coronavirus 2 by RT PCR: NEGATIVE

## 2022-01-21 LAB — URINALYSIS, ROUTINE W REFLEX MICROSCOPIC
Bilirubin Urine: NEGATIVE
Glucose, UA: 100 mg/dL — AB
Hgb urine dipstick: NEGATIVE
Ketones, ur: 40 mg/dL — AB
Leukocytes,Ua: NEGATIVE
Nitrite: NEGATIVE
Protein, ur: 30 mg/dL — AB
Specific Gravity, Urine: 1.015 (ref 1.005–1.030)
pH: 8.5 — ABNORMAL HIGH (ref 5.0–8.0)

## 2022-01-21 LAB — CBC
HCT: 46.2 % (ref 39.0–52.0)
Hemoglobin: 16.2 g/dL (ref 13.0–17.0)
MCH: 31.7 pg (ref 26.0–34.0)
MCHC: 35.1 g/dL (ref 30.0–36.0)
MCV: 90.4 fL (ref 80.0–100.0)
Platelets: 303 10*3/uL (ref 150–400)
RBC: 5.11 MIL/uL (ref 4.22–5.81)
RDW: 12.2 % (ref 11.5–15.5)
WBC: 14.1 10*3/uL — ABNORMAL HIGH (ref 4.0–10.5)
nRBC: 0 % (ref 0.0–0.2)

## 2022-01-21 LAB — URINALYSIS, MICROSCOPIC (REFLEX)

## 2022-01-21 LAB — RAPID URINE DRUG SCREEN, HOSP PERFORMED
Amphetamines: NOT DETECTED
Barbiturates: NOT DETECTED
Benzodiazepines: NOT DETECTED
Cocaine: NOT DETECTED
Opiates: NOT DETECTED
Tetrahydrocannabinol: POSITIVE — AB

## 2022-01-21 LAB — LIPASE, BLOOD: Lipase: 23 U/L (ref 11–51)

## 2022-01-21 MED ORDER — SODIUM CHLORIDE 0.9 % IV BOLUS
1000.0000 mL | Freq: Once | INTRAVENOUS | Status: AC
  Administered 2022-01-21: 1000 mL via INTRAVENOUS

## 2022-01-21 MED ORDER — MORPHINE SULFATE (PF) 4 MG/ML IV SOLN: 4.0000 mg | Freq: Once | INTRAVENOUS | Status: DC

## 2022-01-21 MED ORDER — DROPERIDOL 2.5 MG/ML IJ SOLN
1.2500 mg | Freq: Once | INTRAMUSCULAR | Status: AC
  Administered 2022-01-21: 1.25 mg via INTRAVENOUS
  Filled 2022-01-21: qty 2

## 2022-01-21 MED ORDER — ONDANSETRON 8 MG PO TBDP: 8.0000 mg | ORAL_TABLET | Freq: Three times a day (TID) | ORAL | 0 refills | Status: AC | PRN

## 2022-01-21 MED ORDER — ONDANSETRON 4 MG PO TBDP
4.0000 mg | ORAL_TABLET | Freq: Once | ORAL | Status: DC | PRN
  Filled 2022-01-21: qty 1

## 2022-01-21 NOTE — Discharge Instructions (Signed)
You can take Zofran every 8 hours as needed for nausea.  Continue drinking fluids at home, with abstain from any marijuana use this is probably the vomiting today.  If the pain localizes to the right lower quadrant, becomes severe and constant without any alleviation please return back to the ED for additional work-up.

## 2022-01-21 NOTE — ED Provider Notes (Signed)
[facility] EMERGENCY DEPARTMENT Provider Note   CSN: [id number] Arrival date & time: 01/21/22  [id number]     History  Chief Complaint  Patient presents with   Abdominal Pain   Nausea    Darren Curtis is a 26 y.o. male.   Abdominal Pain  Patient is a 26 year old male presenting with nausea and vomiting since last night.  States she is having abdominal pain that occurs after he vomits, not focal.  Daily marijuana user, alcohol use recreationally but none in the last 48 hours.  Has not tried anything to alleviate his symptoms, denies any obvious provoking features.  No previous abdominal surgeries, no sick contacts to his knowledge.  Denies any diarrhea.  Home Medications Prior to Admission medications   Medication Sig Start Date End Date Taking? Authorizing Provider  ondansetron (ZOFRAN-ODT) 8 MG disintegrating tablet Take 1 tablet (8 mg total) by mouth every 8 (eight) hours as needed for nausea or vomiting. 01/21/22  Yes Sherrill Raring, PA-C  Ascorbic Acid (VITAMIN C PO) Take 1 tablet by mouth daily.    [provider]  lidocaine (LIDODERM) 5 % Place 1 patch onto the skin daily. Remove & Discard patch within 12 hours or as directed by MD Patient not taking: Reported on 07/05/2020 07/24/17   Antonietta Breach, PA-C  potassium chloride SA (KLOR-CON) 20 MEQ tablet Take 1 tablet (20 mEq total) by mouth daily for 5 days. 07/05/20 07/10/20  Tacy Learn, PA-C      Allergies    Patient has no known allergies.    Review of Systems   Review of Systems  Gastrointestinal:  Positive for abdominal pain.   Physical Exam Updated Vital Signs BP 122/71    Pulse (!) 58    Temp 98.7 F (37.1 C)    Resp 19    Ht 5\' 9"  (1.753 m)    Wt 61.2 kg    SpO2 100%    BMI 19.94 kg/m  Physical Exam Vitals and nursing note reviewed. Exam conducted with a chaperone present.  Constitutional:      Appearance: Normal appearance.  HENT:     Head: Normocephalic and atraumatic.  Eyes:      General: No scleral icterus.       Right eye: No discharge.        Left eye: No discharge.     Extraocular Movements: Extraocular movements intact.     Pupils: Pupils are equal, round, and reactive to light.  Cardiovascular:     Rate and Rhythm: Normal rate and regular rhythm.     Pulses: Normal pulses.     Heart sounds: Normal heart sounds. No murmur heard.   No friction rub. No gallop.  Pulmonary:     Effort: Pulmonary effort is normal. No respiratory distress.     Breath sounds: Normal breath sounds.  Abdominal:     General: Abdomen is flat. Bowel sounds are normal. There is no distension.     Palpations: Abdomen is soft.     Tenderness: There is generalized abdominal tenderness.  Skin:    General: Skin is warm and dry.     Coloration: Skin is not jaundiced.  Neurological:     Mental Status: He is alert. Mental status is at baseline.     Coordination: Coordination normal.    ED Results / Procedures / Treatments   Labs (all labs ordered are listed, but only abnormal results are displayed) Labs Reviewed  COMPREHENSIVE METABOLIC PANEL - Abnormal;  Notable for the following components:      Result Value   Glucose, Bld 154 (*)    Albumin 5.2 (*)    All other components within normal limits  CBC - Abnormal; Notable for the following components:   WBC 14.1 (*)    All other components within normal limits  URINALYSIS, ROUTINE W REFLEX MICROSCOPIC - Abnormal; Notable for the following components:   pH 8.5 (*)    Glucose, UA 100 (*)    Ketones, ur 40 (*)    Protein, ur 30 (*)    All other components within normal limits  RAPID URINE DRUG SCREEN, HOSP PERFORMED - Abnormal; Notable for the following components:   Tetrahydrocannabinol POSITIVE (*)    All other components within normal limits  URINALYSIS, MICROSCOPIC (REFLEX) - Abnormal; Notable for the following components:   Bacteria, UA RARE (*)    All other components within normal limits  RESP PANEL BY RT-PCR (FLU A&B,  COVID) ARPGX2  LIPASE, BLOOD    EKG None  Radiology No results found.  Procedures Procedures    Medications Ordered in ED Medications  sodium chloride 0.9 % bolus 1,000 mL (has no administration in time range)  sodium chloride 0.9 % bolus 1,000 mL (0 mLs Intravenous Stopped 01/21/22 1136)  droperidol (INAPSINE) 2.5 MG/ML injection 1.25 mg (1.25 mg Intravenous Given 01/21/22 1112)    ED Course/ Medical Decision Making/ A&P                           Medical Decision Making Amount and/or Complexity of Data Reviewed Labs: ordered.  Risk Prescription drug management.   This patient presents to the ED for concern of emesis and abdominal pain, this involves an extensive number of treatment options, and is a complaint that carries with it a high risk of complications and morbidity.  The differential diagnosis includes gastroenteritis, appendicitis, cannabis hyperemesis, gastritis, colitis, other   Co morbidities that complicate the patient evaluation: N/A   Additional history obtained: -Additional history obtained from mother -External records from outside source obtained and reviewed including: Chart review including previous notes, labs, imaging, consultation notes   Lab Tests: -I ordered, reviewed, and interpreted labs.  The pertinent results include: Slight leukocytosis of 14.1.  No gross electrolyte derangement, no evidence of UTI or Pilo on the UA.  There is ketonuria and proteinuria consistent with his picture of multiple episodes of emesis.  UDS notable for marijuana usage.   Medicines ordered and prescription drug management: -I ordered medication including droperidol, normal saline for volume depletion and nausea -Reevaluation of the patient after these medicines showed that the patient improved -I have reviewed the patients home medicines and have made adjustments as needed   ED Course: Stable vitals, no tachycardia or hypotension.  Abdomen is soft, no rigidity or  guarding or peritoneal signs noted.  There is diffuse tenderness but not focal.  No rebound or Murphy or right lower quadrant tenderness.  I considered an early appendicitis, serial abdominal exams have been benign.  No episodes of emesis here in the ED, patient reports improvement with fluids and Zofran and so droperidol alone.  Discussed option of proceeding with imaging, given no focal tenderness I do not think there would be eliminating at this time.  Discussed with the patient and his mother and they are in agreement with strict return precautions.  I suspect patient's presentation is likely due to cannabis hyperemesis, discussed abstinence from marijuana use  as a trial.   Reevaluation: After the interventions noted above, I reevaluated the patient and found that they have :resolved   Dispostion: D/C         Final Clinical Impression(s) / ED Diagnoses Final diagnoses:  Nausea and vomiting, unspecified vomiting type  Cannabis hyperemesis syndrome concurrent with and due to cannabis abuse [facility])    Rx / DC Orders ED Discharge Orders          Ordered    ondansetron (ZOFRAN-ODT) 8 MG disintegrating tablet  Every 8 hours PRN        01/21/22 1212              Sherrill Raring, PA-C 01/21/22 [place], Dan, DO 01/21/22 1501

## 2022-01-21 NOTE — ED Triage Notes (Signed)
Pt here POV with c/o of N/V since 0200 01/21/22. Pt reports abdominal 8/10. Pt pale in triage. Alert and oriented X4.
